# Patient Record
Sex: Female | Born: 2000 | Race: White | Hispanic: No | Marital: Single | Smoking: Never smoker
Health system: Southern US, Community
[De-identification: ages and names within clinical notes are randomized; demographics above are authoritative.]

---
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
## 2022-04-10 ENCOUNTER — Ambulatory Visit
Admission: EM | Admit: 2022-04-10 | Discharge: 2022-04-10 | Disposition: A | Discharge status: Home / Self Care | Payer: BC Managed Care – PPO | Attending: Family Medicine | Admitting: Family Medicine

## 2022-04-10 DIAGNOSIS — Z3202 Encounter for pregnancy test, result negative: Secondary | ICD-10-CM

## 2022-04-10 DIAGNOSIS — Z1152 Encounter for screening for COVID-19: Secondary | ICD-10-CM | POA: Diagnosis not present

## 2022-04-10 DIAGNOSIS — J069 Acute upper respiratory infection, unspecified: Secondary | ICD-10-CM | POA: Diagnosis not present

## 2022-04-10 DIAGNOSIS — R059 Cough, unspecified: Secondary | ICD-10-CM | POA: Insufficient documentation

## 2022-04-10 LAB — RESP PANEL BY RT-PCR (FLU A&B, COVID) ARPGX2
Influenza A by PCR: NEGATIVE
Influenza B by PCR: NEGATIVE
SARS Coronavirus 2 by RT PCR: NEGATIVE

## 2022-04-10 LAB — POCT URINE PREGNANCY: Preg Test, Ur: NEGATIVE

## 2022-04-10 MED ORDER — FLUTICASONE PROPIONATE 50 MCG/ACT NA SUSP: 1.0000 | Freq: Two times a day (BID) | NASAL | 2 refills | Status: AC

## 2022-04-10 MED ORDER — PROMETHAZINE-DM 6.25-15 MG/5ML PO SYRP: 5.0000 mL | ORAL_SOLUTION | Freq: Four times a day (QID) | ORAL | 0 refills | Status: AC | PRN

## 2022-04-10 NOTE — ED Triage Notes (Signed)
Pt reports last she had a sore throat , she had really bad chills. Woke up today with nauseas and feeling worse, headaches, body aches, and nasal drainage. Took alkezter cold and flu. Today she took Excedrin ES which gave slight relief.    Pt is a week late wants a preg test.

## 2022-04-10 NOTE — ED Provider Notes (Signed)
[facility]    CSN: [id number] Arrival date & time: 04/10/22  1411      History   Chief Complaint No chief complaint on file.   HPI Kaitlyn Rogers is a 21 y.o. female.   Patient presenting today with several day history of sore throat, chills, sweats, fever, cough, congestion.  Denies chest pain, shortness of breath, abdominal pain but is now having some nausea without vomiting.  Taking Excedrin and took a dose of cough medicine with minimal relief.  She is also requesting a pregnancy test as her period is almost a week late.  She uses the NuvaRing for contraception.    History reviewed. No pertinent past medical history.  There are no problems to display for this patient.   History reviewed. No pertinent surgical history.  OB History     Gravida  0   Para  0   Term  0   Preterm  0   AB  0   Living         SAB  0   IAB  0   Ectopic  0   Multiple      Live Births               Home Medications    Prior to Admission medications   Medication Sig Start Date End Date Taking? Authorizing Provider  fluticasone (FLONASE) 50 MCG/ACT nasal spray Place 1 spray into both nostrils 2 (two) times daily. 04/10/22  Yes Particia Nearing, PA-C  promethazine-dextromethorphan (PROMETHAZINE-DM) 6.25-15 MG/5ML syrup Take 5 mLs by mouth 4 (four) times daily as needed. 04/10/22  Yes Particia Nearing, PA-C  albuterol (VENTOLIN HFA) 108 (90 Base) MCG/ACT inhaler Inhale 1-2 puffs into the lungs every 6 (six) hours as needed for wheezing or shortness of breath. 02/27/21   Wallis Bamberg, PA-C  etonogestrel-ethinyl estradiol (NUVARING) 0.12-0.015 MG/24HR vaginal ring Place 1 each vaginally every 28 (twenty-eight) days. Insert vaginally and leave in place for 3 consecutive weeks, then remove for 1 week.    [provider]  levocetirizine (XYZAL) 5 MG tablet Take 5 mg by mouth daily as needed for allergies.    [provider]  oseltamivir  (TAMIFLU) 75 MG capsule Take 1 capsule (75 mg total) by mouth 2 (two) times daily. 02/27/21   Wallis Bamberg, PA-C  RA CHLORPHENIRAMINE MALEATE 4 MG tablet Take 4 mg by mouth at bedtime.    [provider]    Family History History reviewed. No pertinent family history.  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 --      Excl. in GC? --    No data found.  Updated Vital Signs BP 122/79 (BP Location: Right Arm)   Pulse 87   Temp 98 F (36.7 C) (Oral)   Resp 18   LMP 03/06/2022 Comment: pt doesnt know if her cycles are irregular or not. But she is a week late .  SpO2 99%   Visual Acuity Right Eye Distance:   Left Eye Distance:   Bilateral Distance:    Right Eye Near:   Left Eye Near:    Bilateral Near:     Physical Exam Vitals and nursing note reviewed.  Constitutional:      Appearance: Normal appearance.  HENT:     Head: Atraumatic.     Right Ear: Tympanic membrane and external ear normal.     Left Ear: Tympanic membrane and external ear normal.     Nose: Rhinorrhea present.     Mouth/Throat:     Mouth: Mucous membranes are moist.     Pharynx: Posterior oropharyngeal erythema present.  Eyes:     Extraocular Movements: Extraocular movements intact.      Conjunctiva/sclera: Conjunctivae normal.  Cardiovascular:     Rate and Rhythm: Normal rate and regular rhythm.     Heart sounds: Normal heart sounds.  Pulmonary:     Effort: Pulmonary effort is normal.     Breath sounds: Normal breath sounds. No wheezing.  Musculoskeletal:        General: Normal range of motion.     Cervical back: Normal range of motion and neck supple.  Skin:    General: Skin is warm and dry.  Neurological:     Mental Status: She is alert and oriented to person, place, and time.  Psychiatric:        Mood and Affect: Mood normal.        Thought Content: Thought content normal.      UC Treatments / Results  Labs (all labs ordered are listed, but only abnormal results are displayed) Labs Reviewed  RESP PANEL BY RT-PCR (FLU A&B, COVID) [id number]  POCT URINE PREGNANCY    EKG   Radiology No results found.  Procedures Procedures (including critical care time)  Medications Ordered in UC Medications - No data to display  Initial Impression / Assessment and Plan / UC Course  I have reviewed the triage vital signs and the nursing notes.  Pertinent labs & imaging results that were available during my care of the patient were reviewed by me and considered in my medical decision making (see chart for details).     Vitals and exam overall reassuring today and suggestive of a viral upper respiratory infection.  Respiratory panel pending, treat with Phenergan DM, Flonase, supportive over-the-counter medications and home care.  Work note given.  Urine pregnancy was negative, these results were discussed with patient and questions were answered.  Final Clinical Impressions(s) / UC Diagnoses   Final diagnoses:  Viral URI with cough   Discharge Instructions   None    ED Prescriptions     Medication Sig Dispense Auth. Provider   promethazine-dextromethorphan (PROMETHAZINE-DM) 6.25-15 MG/5ML syrup Take 5 mLs by mouth 4 (four) times daily as needed. 100 mL Particia Nearing, PA-C   fluticasone [facility]) 50 MCG/ACT nasal spray Place 1 spray into both nostrils  2 (two) times daily. 16 g Particia Nearing, [place]      PDMP not reviewed this encounter.   Particia Nearing, [place] 04/10/22 1639

## 2022-04-13 ENCOUNTER — Encounter (HOSPITAL_BASED_OUTPATIENT_CLINIC_OR_DEPARTMENT_OTHER): Payer: Self-pay | Admitting: Emergency Medicine

## 2022-04-13 ENCOUNTER — Emergency Department (HOSPITAL_BASED_OUTPATIENT_CLINIC_OR_DEPARTMENT_OTHER): Payer: BC Managed Care – PPO | Admitting: Radiology

## 2022-04-13 ENCOUNTER — Emergency Department (HOSPITAL_BASED_OUTPATIENT_CLINIC_OR_DEPARTMENT_OTHER)
Admission: EM | Admit: 2022-04-13 | Discharge: 2022-04-14 | Disposition: A | Discharge status: Home / Self Care | Payer: BC Managed Care – PPO | Attending: Emergency Medicine | Admitting: Emergency Medicine

## 2022-04-13 ENCOUNTER — Other Ambulatory Visit: Payer: Self-pay

## 2022-04-13 DIAGNOSIS — Z20822 Contact with and (suspected) exposure to covid-19: Secondary | ICD-10-CM | POA: Diagnosis not present

## 2022-04-13 DIAGNOSIS — R0789 Other chest pain: Secondary | ICD-10-CM | POA: Diagnosis not present

## 2022-04-13 DIAGNOSIS — J209 Acute bronchitis, unspecified: Secondary | ICD-10-CM | POA: Diagnosis not present

## 2022-04-13 DIAGNOSIS — R079 Chest pain, unspecified: Secondary | ICD-10-CM | POA: Diagnosis not present

## 2022-04-13 DIAGNOSIS — U070 Vaping-related disorder: Secondary | ICD-10-CM | POA: Diagnosis not present

## 2022-04-13 DIAGNOSIS — U071 COVID-19: Secondary | ICD-10-CM | POA: Diagnosis not present

## 2022-04-13 DIAGNOSIS — J9801 Acute bronchospasm: Secondary | ICD-10-CM | POA: Diagnosis not present

## 2022-04-13 LAB — CBC
HCT: 36.4 % (ref 36.0–46.0)
Hemoglobin: 12.5 g/dL (ref 12.0–15.0)
MCH: 29.5 pg (ref 26.0–34.0)
MCHC: 34.3 g/dL (ref 30.0–36.0)
MCV: 85.8 fL (ref 80.0–100.0)
Platelets: 255 10*3/uL (ref 150–400)
RBC: 4.24 MIL/uL (ref 3.87–5.11)
RDW: 12.2 % (ref 11.5–15.5)
WBC: 7.5 10*3/uL (ref 4.0–10.5)
nRBC: 0 % (ref 0.0–0.2)

## 2022-04-13 LAB — BASIC METABOLIC PANEL
Anion gap: 10 (ref 5–15)
BUN: 11 mg/dL (ref 6–20)
CO2: 25 mmol/L (ref 22–32)
Calcium: 9.7 mg/dL (ref 8.9–10.3)
Chloride: 103 mmol/L (ref 98–111)
Creatinine, Ser: 0.64 mg/dL (ref 0.44–1.00)
GFR, Estimated: >60 mL/min (ref >60)
Glucose, Bld: 97 mg/dL (ref 70–99)
Potassium: 3.9 mmol/L (ref 3.5–5.1)
Sodium: 138 mmol/L (ref 135–145)

## 2022-04-13 LAB — RESP PANEL BY RT-PCR (RSV, FLU A&B, COVID)  RVPGX2
Influenza A by PCR: NEGATIVE
Influenza B by PCR: NEGATIVE
Resp Syncytial Virus by PCR: NEGATIVE
SARS Coronavirus 2 by RT PCR: NEGATIVE

## 2022-04-13 LAB — TROPONIN I (HIGH SENSITIVITY)
Troponin I (High Sensitivity): <2 ng/L (ref <18)
Troponin I (High Sensitivity): <2 ng/L (ref <18)

## 2022-04-13 MED ORDER — AEROCHAMBER PLUS FLO-VU LARGE MISC
1.0000 | Freq: Once | Status: DC
  Filled 2022-04-13: qty 1

## 2022-04-13 NOTE — ED Notes (Addendum)
Pt awake and alert lying in bed; GCS 15.  RR even and unlabored on RA with symmetrical rise and fall of chest.  S1 and S2 regular upon auscultation; lung sounds CTA with diminished lung sounds to posterior R bases.  Abdomen soft, nontender - BS x4 normactive.  Pt reports ongoing intermittent cp and sob with intermittent nausea- denies vomiting and denies diarrhea.  VSS.   Repeat trop pending and covid/flu/rsv swab pending. Will monitor for acute changes and maintain; plan of care.

## 2022-04-13 NOTE — ED Triage Notes (Addendum)
Pt presents to ED POV. Pt c/o CP and Sob that began this afternoon. Pt reports that it feels like theres bricks sitting on her chest. She was sick earlier this week. Tested negative for covid and flu at [facility]. Pt reports heavy vaping

## 2022-04-13 NOTE — ED Notes (Signed)
RT educated pt on proper use of MDI w/spacer. Pt also educated on smoking cessation/vaping. Pt verbalizes understanding of teaching.

## 2022-04-13 NOTE — ED Notes (Signed)
ED Provider at bedside with RT

## 2022-04-13 NOTE — ED Provider Notes (Signed)
[facility] Provider Note: Lowella Dell, MD, FACEP  CSN: [id number] MRN: [id number] ARRIVAL: 04/13/22 at 1958 ROOM: DB002/DB002   CHIEF COMPLAINT  Chest Pain   HISTORY OF PRESENT ILLNESS  04/13/22 11:01 PM Kaitlyn Rogers is a 21 y.o. female who developed an illness about 3 to 4 days ago.  Specifically she had sore throat (now resolved), nasal congestion, body aches, cough, shortness of breath and wheezing.  The shortness of breath and wheezing are worse with exertion.  They have not been adequately relieved with her inhaler.  Earlier this evening she got so short of breath she felt like there were bricks sitting on her chest preventing her from taking in a deep breath.  She was seen in urgent care 3 days ago and tested negative for COVID and influenza.  She does admit to heavy vaping.  She rates the discomfort in her chest as a 9 out of 10 at its worst.   History reviewed. No pertinent past medical history.  History reviewed. No pertinent surgical history.  History reviewed. No pertinent family history.  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 Authorizing Provider  albuterol (VENTOLIN HFA) 108 (90 Base) MCG/ACT inhaler Inhale 1-2 puffs into the lungs every 6 (six) hours as needed for wheezing or shortness of breath. 02/27/21   Wallis Bamberg, PA-C  etonogestrel-ethinyl estradiol (NUVARING) 0.12-0.015 MG/24HR vaginal ring Place 1 each vaginally every 28 (twenty-eight) days. Insert vaginally and leave in place for 3 consecutive weeks, then remove for 1 week.    [provider]  fluticasone (FLONASE) 50 MCG/ACT nasal spray Place 1 spray into both nostrils 2 (two) times daily.  04/10/22   Particia Nearing, PA-C  levocetirizine (XYZAL) 5 MG tablet Take 5 mg by mouth daily as needed for allergies.    [provider]  promethazine-dextromethorphan (PROMETHAZINE-DM) 6.25-15 MG/5ML syrup Take 5 mLs by mouth 4 (four) times daily as needed. 04/10/22   Particia Nearing, PA-C  RA CHLORPHENIRAMINE MALEATE 4 MG tablet Take 4 mg by mouth at bedtime.    [provider]    Allergies Penicillins   REVIEW OF SYSTEMS  Negative except as noted here or in the History of Present Illness.   PHYSICAL EXAMINATION  Initial Vital Signs Blood pressure 108/69, pulse 80, temperature 98.7 F (37.1 C), temperature source Oral, resp. rate 16, last menstrual period 03/06/2022, SpO2 99 %.  Examination General: Well-developed, well-nourished female in no acute distress; appearance consistent with age of record HENT: normocephalic; atraumatic; no pharyngeal erythema or exudate Eyes: Normal appearance Neck: supple Heart: regular rate and rhythm Lungs: clear to auscultation bilaterally Abdomen: soft; nondistended; nontender; bowel sounds present Extremities: No deformity; full range of motion; pulses normal Neurologic: Awake, alert and oriented; motor function intact in all extremities and symmetric; no facial droop Skin: Warm and dry Psychiatric: Normal mood and affect   RESULTS  Summary of this visit's results, reviewed and interpreted by myself:   EKG Interpretation  Date/Time:  Saturday April 13 2022 20:08:20 EST Ventricular Rate:  71 PR Interval:  132 QRS Duration: 88 QT Interval:  358  QTC Calculation: 389 R Axis:   87 Text Interpretation: Normal sinus rhythm Normal ECG No previous ECGs available Confirmed by Ernie Avena ([id number]) on 04/13/2022 9:34:28 PM       Laboratory Studies: Results for orders placed or performed during the hospital encounter of 04/13/22 (from the past 24 hour(s))  Basic metabolic panel     Status: None   Collection  Time: 04/13/22  8:10 PM  Result Value Ref Range   Sodium 138 135 - 145 mmol/L   Potassium 3.9 3.5 - 5.1 mmol/L   Chloride 103 98 - 111 mmol/L   CO2 25 22 - 32 mmol/L   Glucose, Bld 97 70 - 99 mg/dL   BUN 11 6 - 20 mg/dL   Creatinine, Ser 8.52 0.44 - 1.00 mg/dL   Calcium 9.7 8.9 - 77.8 mg/dL   GFR, Estimated >24 >23 mL/min   Anion gap 10 5 - 15  CBC     Status: None   Collection Time: 04/13/22  8:10 PM  Result Value Ref Range   WBC 7.5 4.0 - 10.5 K/uL   RBC 4.24 3.87 - 5.11 MIL/uL   Hemoglobin 12.5 12.0 - 15.0 g/dL   HCT 53.6 14.4 - 31.5 %   MCV 85.8 80.0 - 100.0 fL   MCH 29.5 26.0 - 34.0 pg   MCHC 34.3 30.0 - 36.0 g/dL   RDW 40.0 86.7 - 61.9 %   Platelets 255 150 - 400 K/uL   nRBC 0.0 0.0 - 0.2 %  Troponin I (High Sensitivity)     Status: None   Collection Time: 04/13/22  8:10 PM  Result Value Ref Range   Troponin I (High Sensitivity) <2 <18 ng/L  Troponin I (High Sensitivity)     Status: None   Collection Time: 04/13/22 10:14 PM  Result Value Ref Range   Troponin I (High Sensitivity) <2 <18 ng/L  Resp panel by RT-PCR (RSV, Flu A&B, Covid) Anterior Nasal Swab     Status: None   Collection Time: 04/13/22 10:25 PM   Specimen: Anterior Nasal Swab  Result Value Ref Range   SARS Coronavirus 2 by RT PCR NEGATIVE NEGATIVE   Influenza A by PCR NEGATIVE NEGATIVE   Influenza B by PCR NEGATIVE NEGATIVE   Resp Syncytial Virus by PCR NEGATIVE NEGATIVE   Imaging Studies: DG Chest 2 View  Result Date: 04/13/2022 CLINICAL DATA:  Chest pain EXAM: CHEST - 2 VIEW COMPARISON:  05/15/13 FINDINGS: The heart size and mediastinal contours are within normal limits. Both lungs are clear. The visualized skeletal structures are unremarkable. IMPRESSION: No active cardiopulmonary disease. Electronically Signed   By: Alcide Clever M.D.   On: 04/13/2022 20:30    ED COURSE and MDM  Nursing notes, initial and subsequent vitals signs, including pulse oximetry, reviewed and interpreted by  myself.  Vitals:   04/13/22 2004 04/13/22 2223 04/13/22 2335  BP: 116/86 108/69   Pulse: 72 80 78  Resp: 19 16 18   Temp: 98 F (36.7 C) 98.7 F (37.1 C)   TempSrc: Oral Oral   SpO2: 100% 99% 98%   Medications  AeroChamber Plus Flo-Vu Large MISC 1 each (has no administration in time range)   11:47 PM The patient's illness earlier this week is consistent with a virus although she test negative for COVID, influenza and RSV.  I suspect this gave her a viral bronchitis.  I suspect she also has some inflammation of the lungs due to heavy vaping.  We had the respiratory  therapist, Toniann Fail, provide her an AeroChamber to use with her inhaler and instructed her in its use.  Toniann Fail also explained to the risks of vaping and encouraged the patient to discontinue vaping.  She has no evidence of lung disease on her chest x-ray at this time.  Her EKG and troponins were normal and she has no risk factors for cardiac disease.   PROCEDURES  Procedures   ED DIAGNOSES     ICD-10-CM   1. 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

## 2022-04-14 NOTE — ED Notes (Signed)
Pt agreeable with d/c plan as discussed by provider- this nurse has verbally reinforced d/c instructions and provided pt with written copy - pt acknowledges verbal understanding and denies any addl questions concerns needs- pt ambulatory independently at d/c with steady gait; escorted by family; vitals stable; no distress.

## 2022-04-16 ENCOUNTER — Ambulatory Visit
Admission: EM | Admit: 2022-04-16 | Discharge: 2022-04-16 | Discharge status: Against Medical Advice | Payer: BC Managed Care – PPO

## 2022-04-16 ENCOUNTER — Encounter: Payer: Self-pay | Admitting: Emergency Medicine

## 2022-04-16 ENCOUNTER — Ambulatory Visit
Admission: EM | Admit: 2022-04-16 | Discharge: 2022-04-16 | Disposition: A | Discharge status: Home / Self Care | Payer: BC Managed Care – PPO | Attending: Family Medicine | Admitting: Family Medicine

## 2022-04-16 ENCOUNTER — Other Ambulatory Visit: Payer: Self-pay

## 2022-04-16 DIAGNOSIS — J209 Acute bronchitis, unspecified: Secondary | ICD-10-CM | POA: Diagnosis not present

## 2022-04-16 DIAGNOSIS — J3089 Other allergic rhinitis: Secondary | ICD-10-CM

## 2022-04-16 MED ORDER — PREDNISONE 20 MG PO TABS: 40.0000 mg | ORAL_TABLET | Freq: Every day | ORAL | 0 refills | Status: AC

## 2022-04-16 MED ORDER — RA CHLORPHENIRAMINE MALEATE 4 MG PO TABS: 4.0000 mg | ORAL_TABLET | Freq: Every day | ORAL | 0 refills | Status: AC

## 2022-04-16 MED ORDER — LEVOCETIRIZINE DIHYDROCHLORIDE 5 MG PO TABS: 5.0000 mg | ORAL_TABLET | Freq: Every day | ORAL | 2 refills | Status: AC

## 2022-04-16 NOTE — ED Triage Notes (Signed)
Pt no answered phone. 

## 2022-04-16 NOTE — ED Triage Notes (Signed)
Pt reports was diagnosed with bronchitis this weekend and reports has been using prescriptions, inhaler, and otc medication with no change in symptoms. 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

## 2022-04-16 NOTE — ED Notes (Signed)
No answered call or in the lobby.

## 2022-04-20 NOTE — ED Provider Notes (Signed)
[facility]    CSN: [id number] Arrival date & time: 04/16/22  1715      History   Chief Complaint Chief Complaint  Patient presents with   Cough    HPI Kaitlyn Rogers is a 21 y.o. female.   Presenting today with ongoing cough, wheezing, chest tightness, congestion for the past week.  Tested negative for COVID and flu and has been taking Phenergan DM, allergy medications and nasal sprays with minimal relief.  No new sick contacts, does not smoke cigarettes but does vape.  No known chronic pulmonary disease.    History reviewed. No pertinent past medical history.  There are no problems to display for this patient.   History reviewed. No pertinent surgical history.  OB History     Gravida  0   Para  0   Term  0   Preterm  0   AB  0   Living         SAB  0   IAB  0   Ectopic  0   Multiple      Live Births               Home Medications    Prior to Admission medications   Medication Sig Start Date End Date Taking? Authorizing Provider  predniSONE (DELTASONE) 20 MG tablet Take 2 tablets (40 mg total) by mouth daily with breakfast. 04/16/22  Yes Particia Nearing, PA-C  albuterol (VENTOLIN HFA) 108 (90 Base) MCG/ACT inhaler Inhale 1-2 puffs into the lungs every 6 (six) hours as needed for wheezing or shortness of breath. 02/27/21   Wallis Bamberg, PA-C  etonogestrel-ethinyl estradiol (NUVARING) 0.12-0.015 MG/24HR vaginal ring Place 1 each vaginally every 28 (twenty-eight) days. Insert vaginally and leave in place for 3 consecutive weeks, then remove for 1 week.    [provider]  fluticasone (FLONASE) 50 MCG/ACT nasal spray Place 1 spray into both nostrils 2 (two) times daily. 04/10/22   Particia Nearing, PA-C  levocetirizine (XYZAL) 5 MG tablet Take 1 tablet (5 mg total) by mouth daily. 04/16/22   Particia Nearing, PA-C  promethazine-dextromethorphan (PROMETHAZINE-DM) 6.25-15 MG/5ML syrup Take 5 mLs by mouth 4 (four)  times daily as needed. 04/10/22   Particia Nearing, PA-C  RA CHLORPHENIRAMINE MALEATE 4 MG tablet Take 1 tablet (4 mg total) by mouth at bedtime. 04/16/22   Particia Nearing, PA-C    Family History History reviewed. No pertinent family history.  Social History Social History   Tobacco Use   Smoking status: Never   Smokeless tobacco: Never  Vaping Use   Vaping Use: Every day  Substance Use Topics   Alcohol use: Yes    Comment: every other weekend   Drug use: Yes    Types: Marijuana    Comment: daily use     Allergies   Amoxicillin and Penicillins   Review of Systems Review of Systems Per HPI  Physical Exam Triage Vital Signs ED Triage Vitals  Enc Vitals Group     BP 04/16/22 1906 116/73     Pulse Rate 04/16/22 1906 89     Resp 04/16/22 1906 16     Temp 04/16/22 1906 98 F (36.7 C)     Temp Source 04/16/22 1906 Oral     SpO2 04/16/22 1906 99 %     Weight --      Height --      Head Circumference --      Peak  Flow --      Pain Score 04/16/22 1910 10     Pain Loc --      Pain Edu? --      Excl. in GC? --    No data found.  Updated Vital Signs BP 116/73 (BP Location: Right Arm)   Pulse 89   Temp 98 F (36.7 C) (Oral)   Resp 16   LMP 04/14/2022   SpO2 99%   Visual Acuity Right Eye Distance:   Left Eye Distance:   Bilateral Distance:    Right Eye Near:   Left Eye Near:    Bilateral Near:     Physical Exam Vitals and nursing note reviewed.  Constitutional:      Appearance: Normal appearance.  HENT:     Head: Atraumatic.     Right Ear: Tympanic membrane and external ear normal.     Left Ear: Tympanic membrane and external ear normal.     Nose: Rhinorrhea present.     Mouth/Throat:     Mouth: Mucous membranes are moist.     Pharynx: Posterior oropharyngeal erythema present.  Eyes:     Extraocular Movements: Extraocular movements intact.     Conjunctiva/sclera: Conjunctivae normal.  Cardiovascular:     Rate and Rhythm: Normal  rate and regular rhythm.     Heart sounds: Normal heart sounds.  Pulmonary:     Effort: Pulmonary effort is normal.     Breath sounds: Wheezing present.     Comments: Trace wheezes Musculoskeletal:        General: Normal range of motion.     Cervical back: Normal range of motion and neck supple.  Skin:    General: Skin is warm and dry.  Neurological:     Mental Status: She is alert and oriented to person, place, and time.  Psychiatric:        Mood and Affect: Mood normal.        Thought Content: Thought content normal.      UC Treatments / Results  Labs (all labs ordered are listed, but only abnormal results are displayed) Labs Reviewed - No data to display  EKG   Radiology No results found.  Procedures Procedures (including critical care time)  Medications Ordered in UC Medications - No data to display  Initial Impression / Assessment and Plan / UC Course  I have reviewed the triage vital signs and the nursing notes.  Pertinent labs & imaging results that were available during my care of the patient were reviewed by me and considered in my medical decision making (see chart for details).     Ongoing bronchitis type symptoms.  Treat with prednisone, refill allergy regimen and discussed supportive care and return precautions.  Work note given.  Final Clinical Impressions(s) / UC Diagnoses   Final diagnoses:  Acute bronchitis, unspecified organism  Seasonal allergic rhinitis due to other allergic trigger   Discharge Instructions   None    ED Prescriptions     Medication Sig Dispense Auth. Provider   levocetirizine (XYZAL) 5 MG tablet Take 1 tablet (5 mg total) by mouth daily. 30 tablet Particia Nearing, [place]   predniSONE (DELTASONE) 20 MG tablet Take 2 tablets (40 mg total) by mouth daily with breakfast. 10 tablet Particia Nearing, PA-C   RA CHLORPHENIRAMINE MALEATE 4 MG tablet Take 1 tablet (4 mg total) by mouth at bedtime. 30 tablet Particia Nearing, [place]      PDMP not reviewed this encounter.  Particia Nearing, [place] 04/20/22 1445

## 2022-04-30 ENCOUNTER — Ambulatory Visit
Admission: EM | Admit: 2022-04-30 | Discharge: 2022-04-30 | Disposition: A | Discharge status: Home / Self Care | Payer: BC Managed Care – PPO | Attending: Family Medicine | Admitting: Family Medicine

## 2022-04-30 DIAGNOSIS — R0981 Nasal congestion: Secondary | ICD-10-CM | POA: Insufficient documentation

## 2022-04-30 DIAGNOSIS — J029 Acute pharyngitis, unspecified: Secondary | ICD-10-CM | POA: Insufficient documentation

## 2022-04-30 DIAGNOSIS — Z792 Long term (current) use of antibiotics: Secondary | ICD-10-CM | POA: Diagnosis not present

## 2022-04-30 DIAGNOSIS — Z1152 Encounter for screening for COVID-19: Secondary | ICD-10-CM | POA: Diagnosis not present

## 2022-04-30 LAB — POCT RAPID STREP A (OFFICE): Rapid Strep A Screen: NEGATIVE

## 2022-04-30 MED ORDER — AZITHROMYCIN 250 MG PO TABS: ORAL_TABLET | ORAL | 0 refills | Status: AC

## 2022-04-30 NOTE — ED Triage Notes (Signed)
Runny nose, body aches, chills, fever at home of 99.8 last night, sore throat started 3 days ago. Taking OTC cold mediation, and ibuprofen.

## 2022-04-30 NOTE — Discharge Instructions (Signed)
Your strep test today was negative.  We have also tested you for COVID, this will come back tomorrow.  We are covering for a possible bacterial throat infection while awaiting this viral test though your sore throat could still be related to another strain of virus.  Continue taking your allergy medications, supportive over-the-counter medications and take the full course of antibiotics unless your COVID test comes back positive.

## 2022-04-30 NOTE — ED Provider Notes (Signed)
[facility]    CSN: [id number] Arrival date & time: 04/30/22  0807      History   Chief Complaint Chief Complaint  Patient presents with   Cough   Sore Throat   Generalized Body Aches    HPI Kaitlyn Rogers is a 21 y.o. female.   Presenting today with 3 to 4-day history of chills, body aches, runny nose, severe sore throat with difficulty swallowing.  States she has been dealing with some bronchitis and congestion issues for the past 3 weeks which did improve with course of steroid and has been consistent with allergy medication, taking Alka-Seltzer cold and sinus and throat lozenges additionally.  Denies true fever, chest pain, shortness of breath, abdominal pain, nausea vomiting or diarrhea.  No known exposures to sick contacts that she is aware of.  History of seasonal allergies and frequent bronchitis likely secondary to vaping.    History reviewed. No pertinent past medical history.  There are no problems to display for this patient.   History reviewed. No pertinent surgical history.  OB History     Gravida  0   Para  0   Term  0   Preterm  0   AB  0   Living         SAB  0   IAB  0   Ectopic  0   Multiple      Live Births               Home Medications    Prior to Admission medications   Medication Sig Start Date End Date Taking? Authorizing Provider  albuterol (VENTOLIN HFA) 108 (90 Base) MCG/ACT inhaler Inhale 1-2 puffs into the lungs every 6 (six) hours as needed for wheezing or shortness of breath. 02/27/21  Yes Wallis Bamberg, PA-C  azithromycin (ZITHROMAX) 250 MG tablet Take first 2 tablets together, then 1 every day until finished. 04/30/22  Yes Particia Nearing, PA-C  etonogestrel-ethinyl estradiol (NUVARING) 0.12-0.015 MG/24HR vaginal ring Place 1 each vaginally every 28 (twenty-eight) days. Insert vaginally and leave in place for 3 consecutive weeks, then remove for 1 week.   Yes [provider]   fluticasone (FLONASE) 50 MCG/ACT nasal spray Place 1 spray into both nostrils 2 (two) times daily. 04/10/22  Yes Particia Nearing, PA-C  levocetirizine (XYZAL) 5 MG tablet Take 1 tablet (5 mg total) by mouth daily. 04/16/22  Yes Particia Nearing, PA-C  predniSONE (DELTASONE) 20 MG tablet Take 2 tablets (40 mg total) by mouth daily with breakfast. 04/16/22  Yes Particia Nearing, PA-C  promethazine-dextromethorphan (PROMETHAZINE-DM) 6.25-15 MG/5ML syrup Take 5 mLs by mouth 4 (four) times daily as needed. 04/10/22  Yes Particia Nearing, PA-C  RA CHLORPHENIRAMINE MALEATE 4 MG tablet Take 1 tablet (4 mg total) by mouth at bedtime. 04/16/22  Yes Particia Nearing, PA-C    Family History Family History  Problem Relation Age of Onset   Healthy Mother     Social History Social History   Tobacco Use   Smoking status: Never   Smokeless tobacco: Never  Vaping Use   Vaping Use: Every day  Substance Use Topics   Alcohol use: Yes    Comment: every other weekend   Drug use: Yes    Types: Marijuana    Comment: daily use     Allergies   Amoxicillin and Penicillins   Review of Systems Review of Systems Per HPI  Physical Exam Triage Vital Signs  ED Triage Vitals  Enc Vitals Group     BP 04/30/22 0820 113/74     Pulse Rate 04/30/22 0820 95     Resp 04/30/22 0820 16     Temp 04/30/22 0820 98.7 F (37.1 C)     Temp Source 04/30/22 0820 Oral     SpO2 04/30/22 0820 98 %     Weight --      Height --      Head Circumference --      Peak Flow --      Pain Score 04/30/22 0833 10     Pain Loc --      Pain Edu? --      Excl. in GC? --    No data found.  Updated Vital Signs BP 113/74 (BP Location: Right Arm)   Pulse 95   Temp 98.7 F (37.1 C) (Oral)   Resp 16   LMP 04/14/2022 (Approximate)   SpO2 98%   Visual Acuity Right Eye Distance:   Left Eye Distance:   Bilateral Distance:    Right Eye Near:   Left Eye Near:    Bilateral Near:      Physical Exam Vitals and nursing note reviewed.  Constitutional:      Appearance: Normal appearance.  HENT:     Head: Atraumatic.     Right Ear: Tympanic membrane and external ear normal.     Left Ear: Tympanic membrane and external ear normal.     Nose: Congestion present.     Mouth/Throat:     Mouth: Mucous membranes are moist.     Pharynx: Oropharyngeal exudate and posterior oropharyngeal erythema present.  Eyes:     Extraocular Movements: Extraocular movements intact.     Conjunctiva/sclera: Conjunctivae normal.  Cardiovascular:     Rate and Rhythm: Normal rate and regular rhythm.     Heart sounds: Normal heart sounds.  Pulmonary:     Effort: Pulmonary effort is normal.     Breath sounds: Normal breath sounds. No wheezing or rales.  Musculoskeletal:        General: Normal range of motion.     Cervical back: Normal range of motion and neck supple.  Lymphadenopathy:     Cervical: No cervical adenopathy.  Skin:    General: Skin is warm and dry.  Neurological:     Mental Status: She is alert and oriented to person, place, and time.  Psychiatric:        Mood and Affect: Mood normal.        Thought Content: Thought content normal.      UC Treatments / Results  Labs (all labs ordered are listed, but only abnormal results are displayed) Labs Reviewed  SARS CORONAVIRUS 2 (TAT 6-24 HRS)  POCT RAPID STREP A (OFFICE)    EKG   Radiology No results found.  Procedures Procedures (including critical care time)  Medications Ordered in UC Medications - No data to display  Initial Impression / Assessment and Plan / UC Course  I have reviewed the triage vital signs and the nursing notes.  Pertinent labs & imaging results that were available during my care of the patient were reviewed by me and considered in my medical decision making (see chart for details).     Vital signs benign and reassuring today, exam revealing some minimal tonsillar edema, diffuse erythema,  scant exudates bilaterally as well as some nasal congestion.  Rapid strep was negative, COVID 19 testing pending but given their concern for  trigger throat infection will cover with a course of antibiotics while awaiting COVID testing and discussed supportive over-the-counter medications and home care.  Work note given.  Return for worsening symptoms.  Final Clinical Impressions(s) / UC Diagnoses   Final diagnoses:  Acute pharyngitis, unspecified etiology  Nasal congestion     Discharge Instructions      Your strep test today was negative.  We have also tested you for COVID, this will come back tomorrow.  We are covering for a possible bacterial throat infection while awaiting this viral test though your sore throat could still be related to another strain of virus.  Continue taking your allergy medications, supportive over-the-counter medications and take the full course of antibiotics unless your COVID test comes back positive.    ED Prescriptions     Medication Sig Dispense Auth. Provider   azithromycin (ZITHROMAX) 250 MG tablet Take first 2 tablets together, then 1 every day until finished. 6 tablet Particia Nearing, [place]      PDMP not reviewed this encounter.   Roosvelt Maser [place], [place] 04/30/22 [phone]

## 2022-05-01 LAB — SARS CORONAVIRUS 2 (TAT 6-24 HRS): SARS Coronavirus 2: NEGATIVE

## 2022-07-31 DIAGNOSIS — Z113 Encounter for screening for infections with a predominantly sexual mode of transmission: Secondary | ICD-10-CM | POA: Diagnosis not present

## 2022-07-31 DIAGNOSIS — R102 Pelvic and perineal pain: Secondary | ICD-10-CM | POA: Diagnosis not present

## 2022-07-31 DIAGNOSIS — Z114 Encounter for screening for human immunodeficiency virus [HIV]: Secondary | ICD-10-CM | POA: Diagnosis not present

## 2022-07-31 DIAGNOSIS — N941 Unspecified dyspareunia: Secondary | ICD-10-CM | POA: Diagnosis not present

## 2022-08-01 DIAGNOSIS — N941 Unspecified dyspareunia: Secondary | ICD-10-CM | POA: Diagnosis not present

## 2022-08-01 DIAGNOSIS — R102 Pelvic and perineal pain: Secondary | ICD-10-CM | POA: Diagnosis not present

## 2022-08-08 ENCOUNTER — Other Ambulatory Visit (HOSPITAL_COMMUNITY): Payer: Self-pay | Admitting: Internal Medicine

## 2022-08-08 DIAGNOSIS — I7300 Raynaud's syndrome without gangrene: Secondary | ICD-10-CM | POA: Diagnosis not present

## 2022-08-08 DIAGNOSIS — R052 Subacute cough: Secondary | ICD-10-CM | POA: Diagnosis not present

## 2022-08-08 DIAGNOSIS — Z6821 Body mass index (BMI) 21.0-21.9, adult: Secondary | ICD-10-CM | POA: Diagnosis not present

## 2022-08-08 DIAGNOSIS — Z0000 Encounter for general adult medical examination without abnormal findings: Secondary | ICD-10-CM | POA: Diagnosis not present

## 2022-08-08 DIAGNOSIS — Z1331 Encounter for screening for depression: Secondary | ICD-10-CM | POA: Diagnosis not present

## 2023-01-22 ENCOUNTER — Other Ambulatory Visit: Payer: Self-pay

## 2023-01-22 ENCOUNTER — Emergency Department (HOSPITAL_COMMUNITY)
Admission: EM | Admit: 2023-01-22 | Discharge: 2023-01-22 | Discharge status: Against Medical Advice | Payer: BC Managed Care – PPO | Attending: Emergency Medicine | Admitting: Emergency Medicine

## 2023-01-22 ENCOUNTER — Emergency Department (HOSPITAL_COMMUNITY): Payer: BC Managed Care – PPO

## 2023-01-22 ENCOUNTER — Encounter (HOSPITAL_COMMUNITY): Payer: Self-pay | Admitting: *Deleted

## 2023-01-22 DIAGNOSIS — R0602 Shortness of breath: Secondary | ICD-10-CM | POA: Diagnosis not present

## 2023-01-22 DIAGNOSIS — Z5321 Procedure and treatment not carried out due to patient leaving prior to being seen by health care provider: Secondary | ICD-10-CM | POA: Diagnosis not present

## 2023-01-22 DIAGNOSIS — R079 Chest pain, unspecified: Secondary | ICD-10-CM | POA: Insufficient documentation

## 2023-01-22 NOTE — ED Triage Notes (Signed)
Pt with mid CP since noon today.  Since pain worse. +SOB

## 2023-01-28 DIAGNOSIS — Z6821 Body mass index (BMI) 21.0-21.9, adult: Secondary | ICD-10-CM | POA: Diagnosis not present

## 2023-01-28 DIAGNOSIS — R0600 Dyspnea, unspecified: Secondary | ICD-10-CM | POA: Diagnosis not present

## 2023-01-28 DIAGNOSIS — R052 Subacute cough: Secondary | ICD-10-CM | POA: Diagnosis not present

## 2023-04-22 DIAGNOSIS — J042 Acute laryngotracheitis: Secondary | ICD-10-CM | POA: Diagnosis not present

## 2023-04-22 DIAGNOSIS — J0100 Acute maxillary sinusitis, unspecified: Secondary | ICD-10-CM | POA: Diagnosis not present

## 2023-07-11 ENCOUNTER — Ambulatory Visit (HOSPITAL_COMMUNITY)
Admission: RE | Admit: 2023-07-11 | Discharge: 2023-07-11 | Disposition: A | Discharge status: Home / Self Care | Payer: Self-pay | Source: Ambulatory Visit | Attending: Internal Medicine | Admitting: Internal Medicine

## 2023-07-11 ENCOUNTER — Other Ambulatory Visit (HOSPITAL_COMMUNITY): Payer: Self-pay | Admitting: Internal Medicine

## 2023-07-11 DIAGNOSIS — R52 Pain, unspecified: Secondary | ICD-10-CM | POA: Diagnosis present

## 2023-07-13 ENCOUNTER — Emergency Department (HOSPITAL_BASED_OUTPATIENT_CLINIC_OR_DEPARTMENT_OTHER)

## 2023-07-13 ENCOUNTER — Encounter (HOSPITAL_BASED_OUTPATIENT_CLINIC_OR_DEPARTMENT_OTHER): Payer: Self-pay

## 2023-07-13 ENCOUNTER — Telehealth (HOSPITAL_BASED_OUTPATIENT_CLINIC_OR_DEPARTMENT_OTHER): Payer: Self-pay | Admitting: Emergency Medicine

## 2023-07-13 ENCOUNTER — Emergency Department (HOSPITAL_BASED_OUTPATIENT_CLINIC_OR_DEPARTMENT_OTHER)
Admission: EM | Admit: 2023-07-13 | Discharge: 2023-07-13 | Disposition: A | Discharge status: Home / Self Care | Attending: Emergency Medicine | Admitting: Emergency Medicine

## 2023-07-13 DIAGNOSIS — R0789 Other chest pain: Secondary | ICD-10-CM | POA: Insufficient documentation

## 2023-07-13 DIAGNOSIS — F419 Anxiety disorder, unspecified: Secondary | ICD-10-CM | POA: Diagnosis not present

## 2023-07-13 DIAGNOSIS — R112 Nausea with vomiting, unspecified: Secondary | ICD-10-CM | POA: Diagnosis not present

## 2023-07-13 DIAGNOSIS — E860 Dehydration: Secondary | ICD-10-CM | POA: Insufficient documentation

## 2023-07-13 DIAGNOSIS — Y92410 Unspecified street and highway as the place of occurrence of the external cause: Secondary | ICD-10-CM | POA: Diagnosis not present

## 2023-07-13 LAB — URINALYSIS, ROUTINE W REFLEX MICROSCOPIC
Bacteria, UA: NONE SEEN
Bilirubin Urine: NEGATIVE
Glucose, UA: NEGATIVE mg/dL
Ketones, ur: >80 mg/dL — AB
Leukocytes,Ua: NEGATIVE
Nitrite: NEGATIVE
Protein, ur: 30 mg/dL — AB
Specific Gravity, Urine: 1.028 (ref 1.005–1.030)
pH: 6.5 (ref 5.0–8.0)

## 2023-07-13 LAB — COMPREHENSIVE METABOLIC PANEL
ALT: 9 U/L (ref 0–44)
AST: 15 U/L (ref 15–41)
Albumin: 5.2 g/dL — ABNORMAL HIGH (ref 3.5–5.0)
Alkaline Phosphatase: 53 U/L (ref 38–126)
Anion gap: 12 (ref 5–15)
BUN: 11 mg/dL (ref 6–20)
CO2: 23 mmol/L (ref 22–32)
Calcium: 10 mg/dL (ref 8.9–10.3)
Chloride: 104 mmol/L (ref 98–111)
Creatinine, Ser: 0.72 mg/dL (ref 0.44–1.00)
GFR, Estimated: >60 mL/min (ref >60)
Glucose, Bld: 83 mg/dL (ref 70–99)
Potassium: 3.4 mmol/L — ABNORMAL LOW (ref 3.5–5.1)
Sodium: 139 mmol/L (ref 135–145)
Total Bilirubin: 1.5 mg/dL — ABNORMAL HIGH (ref 0.0–1.2)
Total Protein: 7.4 g/dL (ref 6.5–8.1)

## 2023-07-13 LAB — CBC
HCT: 40.8 % (ref 36.0–46.0)
Hemoglobin: 14.3 g/dL (ref 12.0–15.0)
MCH: 29.9 pg (ref 26.0–34.0)
MCHC: 35 g/dL (ref 30.0–36.0)
MCV: 85.4 fL (ref 80.0–100.0)
Platelets: 291 10*3/uL (ref 150–400)
RBC: 4.78 MIL/uL (ref 3.87–5.11)
RDW: 12.2 % (ref 11.5–15.5)
WBC: 8 10*3/uL (ref 4.0–10.5)
nRBC: 0 % (ref 0.0–0.2)

## 2023-07-13 LAB — RESP PANEL BY RT-PCR (RSV, FLU A&B, COVID)  RVPGX2
Influenza A by PCR: NEGATIVE
Influenza B by PCR: NEGATIVE
Resp Syncytial Virus by PCR: NEGATIVE
SARS Coronavirus 2 by RT PCR: NEGATIVE

## 2023-07-13 LAB — PREGNANCY, URINE: Preg Test, Ur: NEGATIVE

## 2023-07-13 LAB — TROPONIN I (HIGH SENSITIVITY): Troponin I (High Sensitivity): <2 ng/L (ref <18)

## 2023-07-13 LAB — LIPASE, BLOOD: Lipase: 19 U/L (ref 11–51)

## 2023-07-13 MED ORDER — SODIUM CHLORIDE 0.9 % IV BOLUS
1000.0000 mL | Freq: Once | INTRAVENOUS | Status: AC
  Administered 2023-07-13: 1000 mL via INTRAVENOUS

## 2023-07-13 MED ORDER — ONDANSETRON 4 MG PO TBDP: 4.0000 mg | ORAL_TABLET | Freq: Four times a day (QID) | ORAL | 0 refills | Status: AC | PRN

## 2023-07-13 MED ORDER — LORAZEPAM 1 MG PO TABS: 1.0000 mg | ORAL_TABLET | Freq: Three times a day (TID) | ORAL | 0 refills | Status: AC | PRN

## 2023-07-13 MED ORDER — LORAZEPAM 1 MG PO TABS
1.0000 mg | ORAL_TABLET | Freq: Once | ORAL | Status: AC
  Administered 2023-07-13: 1 mg via ORAL
  Filled 2023-07-13: qty 1

## 2023-07-13 MED ORDER — ONDANSETRON HCL 4 MG/2ML IJ SOLN
4.0000 mg | Freq: Once | INTRAMUSCULAR | Status: AC
  Administered 2023-07-13: 4 mg via INTRAVENOUS
  Filled 2023-07-13: qty 2
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
## 2023-07-13 NOTE — Discharge Instructions (Addendum)
 Please drink plenty of fluids including electrolyte containing fluids, such as pedialyte, gatorade to help with dehydration.  Follow-up with your primary care doctor to discuss your nausea, vomiting, and anxiety if you do not have significant relief over the next few days, again I want you to focus on deep breathing, eating a full meal, and other stress reduction techniques before taking any kind of medication, but you can take the anxiety medication that I prescribed for severe breakthrough panic attack or anxiety.  You can use the nausea medication up to every 6 hours, recommend taking around 30 minutes before you plan to eat.
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
## 2024-01-30 ENCOUNTER — Encounter: Payer: Self-pay | Admitting: Advanced Practice Midwife
